# Patient Record
Sex: Male | Born: 2005 | Race: White | Hispanic: No | Marital: Single
Health system: Southern US, Community
[De-identification: ages and names within clinical notes are randomized; demographics above are authoritative.]

---
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
## 2025-05-09 ENCOUNTER — Encounter: Admitting: Family Medicine
